# Patient Record
Sex: Female | Born: 1990 | Race: White | Hispanic: No | Marital: Single | Smoking: Never smoker
Health system: Southern US, Community
[De-identification: ages and names within clinical notes are randomized; demographics above are authoritative.]

## PROBLEM LIST (undated history)

## (undated) DIAGNOSIS — S161XXS Strain of muscle, fascia and tendon at neck level, sequela: Principal | ICD-10-CM

## (undated) DIAGNOSIS — R51 Headache: Secondary | ICD-10-CM

## (undated) DIAGNOSIS — K900 Celiac disease: Secondary | ICD-10-CM

## (undated) DIAGNOSIS — I2699 Other pulmonary embolism without acute cor pulmonale: Secondary | ICD-10-CM

## (undated) DIAGNOSIS — E039 Hypothyroidism, unspecified: Secondary | ICD-10-CM

## (undated) HISTORY — DX: Headache: R51

## (undated) HISTORY — PX: ORTHOPEDIC SURGERY: SHX850

## (undated) HISTORY — DX: Strain of muscle, fascia and tendon at neck level, sequela: S16.1XXS

## (undated) HISTORY — PX: OTHER SURGICAL HISTORY: SHX169

## (undated) HISTORY — DX: Hypothyroidism, unspecified: E03.9

## (undated) HISTORY — DX: Celiac disease: K90.0

---
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
## 2013-05-13 ENCOUNTER — Encounter (HOSPITAL_COMMUNITY): Payer: Self-pay | Admitting: Emergency Medicine

## 2013-05-13 ENCOUNTER — Emergency Department (HOSPITAL_COMMUNITY)
Admission: EM | Admit: 2013-05-13 | Discharge: 2013-05-13 | Disposition: A | Discharge status: Home / Self Care | Payer: 59 | Attending: Emergency Medicine | Admitting: Emergency Medicine

## 2013-05-13 DIAGNOSIS — Z79899 Other long term (current) drug therapy: Secondary | ICD-10-CM | POA: Insufficient documentation

## 2013-05-13 DIAGNOSIS — R002 Palpitations: Secondary | ICD-10-CM | POA: Insufficient documentation

## 2013-05-13 DIAGNOSIS — T40605A Adverse effect of unspecified narcotics, initial encounter: Secondary | ICD-10-CM | POA: Insufficient documentation

## 2013-05-13 DIAGNOSIS — T50905A Adverse effect of unspecified drugs, medicaments and biological substances, initial encounter: Secondary | ICD-10-CM

## 2013-05-13 DIAGNOSIS — L299 Pruritus, unspecified: Secondary | ICD-10-CM | POA: Insufficient documentation

## 2013-05-13 MED ORDER — DIPHENHYDRAMINE HCL 25 MG PO CAPS
25.0000 mg | ORAL_CAPSULE | Freq: Once | ORAL | Status: AC
  Administered 2013-05-13: 25 mg via ORAL
  Filled 2013-05-13: qty 1

## 2013-05-13 MED ORDER — LORAZEPAM 1 MG PO TABS
1.0000 mg | ORAL_TABLET | Freq: Once | ORAL | Status: AC
  Administered 2013-05-13: 1 mg via ORAL
  Filled 2013-05-13: qty 1

## 2013-05-13 MED ORDER — OXYCODONE-ACETAMINOPHEN 5-325 MG PO TABS
2.0000 | ORAL_TABLET | Freq: Once | ORAL | Status: AC
  Administered 2013-05-13: 2 via ORAL
  Filled 2013-05-13: qty 2

## 2013-05-13 MED ORDER — OXYCODONE-ACETAMINOPHEN 5-325 MG PO TABS: 1.0000 | ORAL_TABLET | ORAL | Status: DC | PRN

## 2013-05-13 NOTE — ED Notes (Signed)
Mother states for the past 24 hrs the pt has felt funny  Mother states pt has been itching so she called the pharmacy to see about reactions to hydrocodone in which she is taking for pain after having her wisdom teeth removed  Tonight pt feels like her heart is racing and like her throat is tightening up on her   Pt is in no acute distress in triage

## 2013-05-17 NOTE — ED Provider Notes (Signed)
History    22 year old female presenting with multiple complaints. She is concerned that she does have an allergic reaction to hydrocodone which is taken for recent wisdom tooth extraction. She is having generalized pruritus. Denies any rash. She feels like her throat is closing and that her heart is racing. She feels like she cannot breathe or swallow. Onset of symptoms was about a day ago but worse in the past several hours. No fevers or chills. No other new exposures that she can readily identify.   CSN: [id number]  Arrival date & time 05/13/13  0414   First MD Initiated Contact with Patient 05/13/13 0422      Chief Complaint  Patient presents with  . Allergic Reaction    (Consider location/radiation/quality/duration/timing/severity/associated sxs/prior treatment) HPI  History reviewed. No pertinent past medical history.  Past Surgical History  Procedure Laterality Date  . Extraction of wisdom teeth      Family History  Problem Relation Age of Onset  . Hypertension Other   . Stroke Other     History  Substance Use Topics  . Smoking status: Never Smoker   . Smokeless tobacco: Not on file  . Alcohol Use: No    OB History   Grav Para Term Preterm Abortions TAB SAB Ect Mult Living                  Review of Systems  All systems reviewed and negative, other than as noted in HPI.   Allergies  Sulfa antibiotics  Home Medications   Current Outpatient Rx  Name  Route  Sig  Dispense  Refill  . FLUoxetine (PROZAC) 10 MG tablet   Oral   Take 10 mg by mouth daily.         Marland Kitchen HYDROcodone-acetaminophen (NORCO) 7.5-325 MG per tablet   Oral   Take 1 tablet by mouth every 4 (four) hours as needed for pain.         Marland Kitchen ibuprofen (ADVIL,MOTRIN) 600 MG tablet   Oral   Take 600 mg by mouth every 8 (eight) hours as needed for pain.         Marland Kitchen oxyCODONE-acetaminophen (PERCOCET/ROXICET) 5-325 MG per tablet   Oral   Take 1-2 tablets by mouth every 4 (four) hours as  needed for pain.   20 tablet   0     BP 126/81  Pulse 91  Temp(Src) 98.5 F (36.9 C) (Oral)  Resp 20  SpO2 97%  LMP 02/17/2013  Physical Exam  Nursing note and vitals reviewed. Constitutional: She appears well-developed and well-nourished. No distress.  HENT:  Head: Normocephalic and atraumatic.  Gingival swelling in the area of her molars. This appears consistent with history of recent wisdom tooth extraction. No bleeding noted. Posterior pharynx is clear. Uvula is midline. Patient is handling her secretions. There is no obvious tongue elevation. Submental tissues are soft. Neck is supple. No stridor. Normal sounding phonation.  Eyes: Conjunctivae are normal. Right eye exhibits no discharge. Left eye exhibits no discharge.  Neck: Neck supple.  Cardiovascular: Normal rate, regular rhythm and normal heart sounds.  Exam reveals no gallop and no friction rub.   No murmur heard. Pulmonary/Chest: Effort normal and breath sounds normal. No respiratory distress.  Abdominal: Soft. She exhibits no distension. There is no tenderness.  Musculoskeletal: She exhibits no edema and no tenderness.  Neurological: She is alert.  Skin: Skin is warm and dry.  Psychiatric: She has a normal mood and affect. Her behavior is normal.  Thought content normal.    ED Course  Procedures (including critical care time)  Labs Reviewed - No data to display No results found.  EKG:  Rhythm: Normal sinus rhythm Small, non-pathologic-appearing Q waves in the inferior leads Vent. rate 75 BPM PR interval 128 ms QRS duration 82 ms QT/QTc 364/406 ms Small, non-pathologic-appearing Q waves in the inferior leads. ST segments: Nonspecific ST changes. Comparison: None  1. Medication side effect, initial encounter   2. Palpitations       MDM  22 year old female with likely side effect of Vicodin. Patient is complaining of some mild pruritus. She does not have a rash. Patient is concerned she is having a  severe allergic reaction. Did not find any objective evidence of this. I suspect that the discomfort from her recent wisdom tooth extraction is causing a lot of her symptoms. Patient is experiencing no respiratory distress. Her posterior pharynx is clear. Chest examination. she is handling her secretions. lungs are clear bilaterally with good air movement. her oxygen saturations are in the high 90s to 100% on room-air. She is hemodynamically stable. Patient was observed in the emergency room for approximately 2 hours with no significant decline in her symptoms. Her exam remains unchanged prior to discharge. Return precautions were discussed with patient and her mother. Prescription for Percocet was given to patient upon her request, although I did explain that she may have a similar side effect from this as well. Encouraged to take NSAIDs in addition to the Percocet.        Raeford Razor, MD 05/17/13 [phone]

## 2016-02-20 ENCOUNTER — Other Ambulatory Visit: Payer: Self-pay | Admitting: Family Medicine

## 2016-02-20 ENCOUNTER — Ambulatory Visit
Admission: RE | Admit: 2016-02-20 | Discharge: 2016-02-20 | Disposition: A | Discharge status: Home / Self Care | Payer: 59 | Source: Ambulatory Visit | Attending: Family Medicine | Admitting: Family Medicine

## 2016-02-20 DIAGNOSIS — R1011 Right upper quadrant pain: Secondary | ICD-10-CM

## 2016-03-26 ENCOUNTER — Other Ambulatory Visit: Payer: Self-pay | Admitting: Gastroenterology

## 2016-03-26 DIAGNOSIS — R110 Nausea: Secondary | ICD-10-CM

## 2016-04-12 ENCOUNTER — Ambulatory Visit (HOSPITAL_COMMUNITY): Payer: 59

## 2016-04-22 ENCOUNTER — Encounter (HOSPITAL_COMMUNITY)
Admission: RE | Admit: 2016-04-22 | Discharge: 2016-04-22 | Disposition: A | Discharge status: Home / Self Care | Payer: 59 | Source: Ambulatory Visit | Attending: Gastroenterology | Admitting: Gastroenterology

## 2016-04-22 DIAGNOSIS — R110 Nausea: Secondary | ICD-10-CM | POA: Insufficient documentation

## 2016-04-22 MED ORDER — TECHNETIUM TC 99M MEBROFENIN IV KIT
5.0000 | PACK | Freq: Once | INTRAVENOUS | Status: AC | PRN
  Administered 2016-04-22: 5 via INTRAVENOUS

## 2019-02-15 ENCOUNTER — Other Ambulatory Visit: Payer: Self-pay | Admitting: Orthopedic Surgery

## 2019-02-15 DIAGNOSIS — M25562 Pain in left knee: Secondary | ICD-10-CM

## 2019-02-22 ENCOUNTER — Other Ambulatory Visit: Payer: Self-pay | Admitting: Physician Assistant

## 2019-02-22 DIAGNOSIS — E079 Disorder of thyroid, unspecified: Secondary | ICD-10-CM

## 2019-02-26 ENCOUNTER — Other Ambulatory Visit: Payer: 59

## 2019-02-26 ENCOUNTER — Other Ambulatory Visit: Payer: Self-pay

## 2019-02-26 ENCOUNTER — Ambulatory Visit (INDEPENDENT_AMBULATORY_CARE_PROVIDER_SITE_OTHER): Payer: Managed Care, Other (non HMO) | Admitting: Neurology

## 2019-02-26 ENCOUNTER — Encounter: Payer: Self-pay | Admitting: Neurology

## 2019-02-26 ENCOUNTER — Ambulatory Visit
Admission: RE | Admit: 2019-02-26 | Discharge: 2019-02-26 | Disposition: A | Discharge status: Home / Self Care | Payer: Managed Care, Other (non HMO) | Source: Ambulatory Visit | Attending: Orthopedic Surgery | Admitting: Orthopedic Surgery

## 2019-02-26 DIAGNOSIS — M25562 Pain in left knee: Secondary | ICD-10-CM

## 2019-02-26 DIAGNOSIS — R51 Headache: Secondary | ICD-10-CM | POA: Diagnosis not present

## 2019-02-26 DIAGNOSIS — S161XXS Strain of muscle, fascia and tendon at neck level, sequela: Secondary | ICD-10-CM | POA: Diagnosis not present

## 2019-02-26 DIAGNOSIS — G4486 Cervicogenic headache: Secondary | ICD-10-CM

## 2019-02-26 HISTORY — DX: Cervicogenic headache: G44.86

## 2019-02-26 HISTORY — DX: Strain of muscle, fascia and tendon at neck level, sequela: S16.1XXS

## 2019-02-26 MED ORDER — GABAPENTIN 100 MG PO CAPS: 100.0000 mg | ORAL_CAPSULE | Freq: Three times a day (TID) | ORAL | 3 refills | Status: DC

## 2019-02-26 MED ORDER — CYCLOBENZAPRINE HCL 10 MG PO TABS: 10.0000 mg | ORAL_TABLET | Freq: Three times a day (TID) | ORAL | 2 refills | Status: DC
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
## 2019-02-26 NOTE — Patient Instructions (Signed)
Take flexeril 10 mg three times a day.  We will start gabapentin 100 mg three times a day, call for any dose adjustments.  Neurontin (gabapentin) may result in drowsiness, ankle swelling, gait instability, or possibly dizziness. Please contact our office if significant side effects occur with this medication.

## 2019-03-01 ENCOUNTER — Ambulatory Visit
Admission: RE | Admit: 2019-03-01 | Discharge: 2019-03-01 | Disposition: A | Discharge status: Home / Self Care | Payer: Managed Care, Other (non HMO) | Source: Ambulatory Visit | Attending: Physician Assistant | Admitting: Physician Assistant

## 2019-03-01 ENCOUNTER — Other Ambulatory Visit: Payer: Self-pay

## 2019-03-01 DIAGNOSIS — E079 Disorder of thyroid, unspecified: Secondary | ICD-10-CM

## 2019-03-21 ENCOUNTER — Other Ambulatory Visit: Payer: Self-pay | Admitting: Neurology

## 2019-03-25 ENCOUNTER — Telehealth: Payer: Self-pay

## 2019-03-25 NOTE — Telephone Encounter (Signed)
Unable to get in contact with the patient to convert their office visit into a webex visit. I left them a voicemail asking to return my call. Office number was provided.   

## 2019-03-29 NOTE — Telephone Encounter (Signed)
Noted  

## 2019-03-29 NOTE — Telephone Encounter (Signed)
Pt has called back in response to trying to convert her OV to a VV.  Pt stated she did not think the appointment was needed, she then stated she wasn't sure if she'd be in town.  Pt stated she will call back when things have calmed down

## 2019-03-30 ENCOUNTER — Ambulatory Visit: Payer: Managed Care, Other (non HMO) | Admitting: Neurology

## 2020-08-16 ENCOUNTER — Other Ambulatory Visit: Payer: Self-pay | Admitting: Family Medicine

## 2020-08-16 ENCOUNTER — Ambulatory Visit
Admission: RE | Admit: 2020-08-16 | Discharge: 2020-08-16 | Disposition: A | Discharge status: Home / Self Care | Payer: BLUE CROSS/BLUE SHIELD | Source: Ambulatory Visit | Attending: Family Medicine | Admitting: Family Medicine

## 2020-08-16 DIAGNOSIS — R1084 Generalized abdominal pain: Secondary | ICD-10-CM

## 2020-08-18 ENCOUNTER — Encounter (HOSPITAL_BASED_OUTPATIENT_CLINIC_OR_DEPARTMENT_OTHER): Payer: Self-pay | Admitting: Emergency Medicine

## 2020-08-18 ENCOUNTER — Other Ambulatory Visit: Payer: Self-pay

## 2020-08-18 ENCOUNTER — Emergency Department (HOSPITAL_BASED_OUTPATIENT_CLINIC_OR_DEPARTMENT_OTHER)
Admission: EM | Admit: 2020-08-18 | Discharge: 2020-08-19 | Disposition: A | Discharge status: Home / Self Care | Payer: BLUE CROSS/BLUE SHIELD | Attending: Emergency Medicine | Admitting: Emergency Medicine

## 2020-08-18 DIAGNOSIS — E039 Hypothyroidism, unspecified: Secondary | ICD-10-CM | POA: Insufficient documentation

## 2020-08-18 DIAGNOSIS — R197 Diarrhea, unspecified: Secondary | ICD-10-CM | POA: Diagnosis not present

## 2020-08-18 DIAGNOSIS — M545 Low back pain: Secondary | ICD-10-CM | POA: Diagnosis present

## 2020-08-18 DIAGNOSIS — A0472 Enterocolitis due to Clostridium difficile, not specified as recurrent: Secondary | ICD-10-CM

## 2020-08-18 DIAGNOSIS — K209 Esophagitis, unspecified without bleeding: Secondary | ICD-10-CM

## 2020-08-18 DIAGNOSIS — R1013 Epigastric pain: Secondary | ICD-10-CM | POA: Diagnosis not present

## 2020-08-18 DIAGNOSIS — K5900 Constipation, unspecified: Secondary | ICD-10-CM | POA: Insufficient documentation

## 2020-08-18 DIAGNOSIS — Z79890 Hormone replacement therapy (postmenopausal): Secondary | ICD-10-CM | POA: Diagnosis not present

## 2020-08-18 DIAGNOSIS — Z79899 Other long term (current) drug therapy: Secondary | ICD-10-CM | POA: Diagnosis not present

## 2020-08-18 HISTORY — DX: Other pulmonary embolism without acute cor pulmonale: I26.99

## 2020-08-18 LAB — CBC WITH DIFFERENTIAL/PLATELET
Abs Immature Granulocytes: 0.03 10*3/uL (ref 0.00–0.07)
Basophils Absolute: 0 10*3/uL (ref 0.0–0.1)
Basophils Relative: 0 %
Eosinophils Absolute: 0.1 10*3/uL (ref 0.0–0.5)
Eosinophils Relative: 1 %
HCT: 38 % (ref 36.0–46.0)
Hemoglobin: 12.3 g/dL (ref 12.0–15.0)
Immature Granulocytes: 0 %
Lymphocytes Relative: 44 %
Lymphs Abs: 4.6 10*3/uL — ABNORMAL HIGH (ref 0.7–4.0)
MCH: 26.9 pg (ref 26.0–34.0)
MCHC: 32.4 g/dL (ref 30.0–36.0)
MCV: 83.2 fL (ref 80.0–100.0)
Monocytes Absolute: 0.5 10*3/uL (ref 0.1–1.0)
Monocytes Relative: 5 %
Neutro Abs: 5.1 10*3/uL (ref 1.7–7.7)
Neutrophils Relative %: 50 %
Platelets: 312 10*3/uL (ref 150–400)
RBC: 4.57 MIL/uL (ref 3.87–5.11)
RDW: 12.9 % (ref 11.5–15.5)
WBC: 10.5 10*3/uL (ref 4.0–10.5)
nRBC: 0 % (ref 0.0–0.2)

## 2020-08-18 LAB — COMPREHENSIVE METABOLIC PANEL
ALT: 113 U/L — ABNORMAL HIGH (ref 0–44)
AST: 62 U/L — ABNORMAL HIGH (ref 15–41)
Albumin: 3.9 g/dL (ref 3.5–5.0)
Alkaline Phosphatase: 64 U/L (ref 38–126)
Anion gap: 13 (ref 5–15)
BUN: 9 mg/dL (ref 6–20)
CO2: 23 mmol/L (ref 22–32)
Calcium: 8.8 mg/dL — ABNORMAL LOW (ref 8.9–10.3)
Chloride: 102 mmol/L (ref 98–111)
Creatinine, Ser: 0.87 mg/dL (ref 0.44–1.00)
GFR calc Af Amer: >60 mL/min (ref >60)
GFR calc non Af Amer: >60 mL/min (ref >60)
Glucose, Bld: 79 mg/dL (ref 70–99)
Potassium: 3.6 mmol/L (ref 3.5–5.1)
Sodium: 138 mmol/L (ref 135–145)
Total Bilirubin: 1 mg/dL (ref 0.3–1.2)
Total Protein: 7.6 g/dL (ref 6.5–8.1)

## 2020-08-18 LAB — URINALYSIS, ROUTINE W REFLEX MICROSCOPIC
Glucose, UA: NEGATIVE mg/dL
Ketones, ur: 40 mg/dL — AB
Nitrite: NEGATIVE
Protein, ur: NEGATIVE mg/dL
Specific Gravity, Urine: 1.025 (ref 1.005–1.030)
pH: 6 (ref 5.0–8.0)

## 2020-08-18 LAB — URINALYSIS, MICROSCOPIC (REFLEX)

## 2020-08-18 LAB — LIPASE, BLOOD: Lipase: 40 U/L (ref 11–51)

## 2020-08-18 LAB — PREGNANCY, URINE: Preg Test, Ur: NEGATIVE

## 2020-08-18 MED ORDER — LIDOCAINE VISCOUS HCL 2 % MT SOLN
15.0000 mL | Freq: Once | OROMUCOSAL | Status: AC
  Administered 2020-08-18: 15 mL via ORAL
  Filled 2020-08-18: qty 15

## 2020-08-18 MED ORDER — ONDANSETRON HCL 4 MG/2ML IJ SOLN
4.0000 mg | Freq: Once | INTRAMUSCULAR | Status: AC
  Administered 2020-08-18: 4 mg via INTRAVENOUS
  Filled 2020-08-18: qty 2

## 2020-08-18 MED ORDER — PANTOPRAZOLE SODIUM 40 MG IV SOLR
40.0000 mg | Freq: Once | INTRAVENOUS | Status: AC
  Administered 2020-08-18: 40 mg via INTRAVENOUS
  Filled 2020-08-18: qty 40

## 2020-08-18 MED ORDER — LACTATED RINGERS IV BOLUS
1000.0000 mL | Freq: Once | INTRAVENOUS | Status: AC
  Administered 2020-08-18: 1000 mL via INTRAVENOUS

## 2020-08-18 MED ORDER — ALUM & MAG HYDROXIDE-SIMETH 200-200-20 MG/5ML PO SUSP
30.0000 mL | Freq: Once | ORAL | Status: AC
  Administered 2020-08-18: 30 mL via ORAL
  Filled 2020-08-18: qty 30
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
## 2020-08-18 NOTE — ED Triage Notes (Addendum)
multiple complaints, states PE 8/16. C-diff per MD> hx of orthopedic surgery. Alert, states poor appetite. Feels SOB. Vss.amb. NAD. PT on elequis

## 2020-08-19 ENCOUNTER — Telehealth (HOSPITAL_BASED_OUTPATIENT_CLINIC_OR_DEPARTMENT_OTHER): Payer: Self-pay | Admitting: Emergency Medicine

## 2020-08-19 MED ORDER — FAMOTIDINE IN NACL 20-0.9 MG/50ML-% IV SOLN
20.0000 mg | Freq: Once | INTRAVENOUS | Status: AC
  Administered 2020-08-19: 20 mg via INTRAVENOUS
  Filled 2020-08-19: qty 50

## 2020-08-19 MED ORDER — OXYCODONE HCL 5 MG PO CAPS
5.0000 mg | ORAL_CAPSULE | ORAL | 0 refills | Status: AC | PRN
Start: 2020-08-19 — End: ?

## 2020-08-19 MED ORDER — VANCOMYCIN HCL 125 MG PO CAPS: 125.0000 mg | ORAL_CAPSULE | Freq: Four times a day (QID) | ORAL | 0 refills | Status: AC

## 2020-08-19 MED ORDER — SODIUM CHLORIDE 0.9 % IV SOLN: INTRAVENOUS | Status: DC | PRN

## 2020-08-19 MED ORDER — APIXABAN 2.5 MG PO TABS
5.0000 mg | ORAL_TABLET | Freq: Once | ORAL | Status: AC
  Administered 2020-08-19: 5 mg via ORAL
  Filled 2020-08-19: qty 2

## 2020-08-19 MED ORDER — METRONIDAZOLE IN NACL 5-0.79 MG/ML-% IV SOLN
500.0000 mg | Freq: Once | INTRAVENOUS | Status: AC
  Administered 2020-08-19: 500 mg via INTRAVENOUS
  Filled 2020-08-19: qty 100

## 2020-08-19 MED ORDER — SUCRALFATE 1 GM/10ML PO SUSP
1.0000 g | Freq: Once | ORAL | Status: AC
  Administered 2020-08-19: 1 g via ORAL
  Filled 2020-08-19: qty 10

## 2020-08-19 NOTE — ED Provider Notes (Signed)
Nursing notes and vitals signs, including pulse oximetry, reviewed.  Summary of this visit's results, reviewed by myself:  EKG:  EKG Interpretation  Date/Time:  Friday August 18 2020 18:29:06 EDT Ventricular Rate:  96 PR Interval:  114 QRS Duration: 82 QT Interval:  316 QTC Calculation: 399 R Axis:   79 Text Interpretation: Sinus rhythm with marked sinus arrhythmia Right atrial enlargement new Nonspecific T wave abnormality Confirmed by Gwyneth Sprout ([id number]) on 08/18/2020 8:12:38 PM       Labs:  Results for orders placed or performed during the hospital encounter of 08/18/20 (from the past 24 hour(s))  Urinalysis, Routine w reflex microscopic Urine, Clean Catch     Status: Abnormal   Collection Time: 08/18/20 10:11 PM  Result Value Ref Range   Color, Urine YELLOW YELLOW   APPearance CLOUDY (A) CLEAR   Specific Gravity, Urine 1.025 1.005 - 1.030   pH 6.0 5.0 - 8.0   Glucose, UA NEGATIVE NEGATIVE mg/dL   Hgb urine dipstick TRACE (A) NEGATIVE   Bilirubin Urine MODERATE (A) NEGATIVE   Ketones, ur 40 (A) NEGATIVE mg/dL   Protein, ur NEGATIVE NEGATIVE mg/dL   Nitrite NEGATIVE NEGATIVE   Leukocytes,Ua TRACE (A) NEGATIVE  Pregnancy, urine     Status: None   Collection Time: 08/18/20 10:11 PM  Result Value Ref Range   Preg Test, Ur NEGATIVE NEGATIVE  Urinalysis, Microscopic (reflex)     Status: Abnormal   Collection Time: 08/18/20 10:11 PM  Result Value Ref Range   RBC / HPF 0-5 0 - 5 RBC/hpf   WBC, UA 0-5 0 - 5 WBC/hpf   Bacteria, UA FEW (A) NONE SEEN   Squamous Epithelial / LPF 6-10 0 - 5  CBC with Differential     Status: Abnormal   Collection Time: 08/18/20 10:45 PM  Result Value Ref Range   WBC 10.5 4.0 - 10.5 K/uL   RBC 4.57 3.87 - 5.11 MIL/uL   Hemoglobin 12.3 12.0 - 15.0 g/dL   HCT 93.7 36 - 46 %   MCV 83.2 80.0 - 100.0 fL   MCH 26.9 26.0 - 34.0 pg   MCHC 32.4 30.0 - 36.0 g/dL   RDW 34.2 87.6 - 81.1 %   Platelets 312 150 - 400 K/uL   nRBC 0.0 0.0 - 0.2 %    Neutrophils Relative % 50 %   Neutro Abs 5.1 1.7 - 7.7 K/uL   Lymphocytes Relative 44 %   Lymphs Abs 4.6 (H) 0.7 - 4.0 K/uL   Monocytes Relative 5 %   Monocytes Absolute 0.5 0 - 1 K/uL   Eosinophils Relative 1 %   Eosinophils Absolute 0.1 0 - 0 K/uL   Basophils Relative 0 %   Basophils Absolute 0.0 0 - 0 K/uL   Immature Granulocytes 0 %   Abs Immature Granulocytes 0.03 0.00 - 0.07 K/uL  Comprehensive metabolic panel     Status: Abnormal   Collection Time: 08/18/20 10:45 PM  Result Value Ref Range   Sodium 138 135 - 145 mmol/L   Potassium 3.6 3.5 - 5.1 mmol/L   Chloride 102 98 - 111 mmol/L   CO2 23 22 - 32 mmol/L   Glucose, Bld 79 70 - 99 mg/dL   BUN 9 6 - 20 mg/dL   Creatinine, Ser 5.72 0.44 - 1.00 mg/dL   Calcium 8.8 (L) 8.9 - 10.3 mg/dL   Total Protein 7.6 6.5 - 8.1 g/dL   Albumin 3.9 3.5 - 5.0 g/dL   AST  62 (H) 15 - 41 U/L   ALT 113 (H) 0 - 44 U/L   Alkaline Phosphatase 64 38 - 126 U/L   Total Bilirubin 1.0 0.3 - 1.2 mg/dL   GFR calc non Af Amer >60 >60 mL/min   GFR calc Af Amer >60 >60 mL/min   Anion gap 13 5 - 15  Lipase, blood     Status: None   Collection Time: 08/18/20 10:45 PM  Result Value Ref Range   Lipase 40 11 - 51 U/L    Imaging Studies: No results found.   12:31 AM Patient still complaining of gastric upset.  We will add IV Pepcid and oral Carafate.  Patient already given Protonix IV.  Will give dose of Flagyl IV as she will not be able to start oral vancomycin until later this morning.  Patient also given evening dose of Eliquis 5 mg.  2:22 AM Patient states her epigastric and esophageal discomfort are unchanged despite multiple medications both topical and parenteral. The patient does not appear ill enough for admission and she does not wish to be admitted to the hospital.  She would like to have her vancomycin prescription called in so she can pick it up and start taking it this morning.  We will also try a 6 short course of oxycodone for  pain.  CRITICAL CARE Performed by: Carlisle Beers Blessings Inglett Total critical care time: 30 minutes Critical care time was exclusive of separately billable procedures and treating other patients. Critical care was necessary to treat or prevent imminent or life-threatening deterioration. Critical care was time spent personally by me on the following activities: development of treatment plan with patient and/or surrogate as well as nursing, discussions with consultants, evaluation of patient's response to treatment, examination of patient, obtaining history from patient or surrogate, ordering and performing treatments and interventions, ordering and review of laboratory studies, ordering and review of radiographic studies, pulse oximetry and re-evaluation of patient's condition.    Elianis Fischbach, Jonny Ruiz, MD 08/19/20 [phone]

## 2020-08-19 NOTE — ED Notes (Signed)
Pt states still having pain and constant burping feel meds are helping  Only minimally.
